# Patient Record
Sex: Female | Born: 1960 | Race: White | Hispanic: No | Smoking: Never smoker
Health system: Southern US, Community
[De-identification: ages and names within clinical notes are randomized; demographics above are authoritative.]

## PROBLEM LIST (undated history)

## (undated) DIAGNOSIS — Z789 Other specified health status: Secondary | ICD-10-CM

## (undated) DIAGNOSIS — H9190 Unspecified hearing loss, unspecified ear: Secondary | ICD-10-CM

## (undated) DIAGNOSIS — E079 Disorder of thyroid, unspecified: Secondary | ICD-10-CM

## (undated) HISTORY — PX: COCHLEAR IMPLANT: SUR684

## (undated) HISTORY — PX: BUNIONECTOMY: SHX129

## (undated) HISTORY — PX: SPINAL CORD STIMULATOR IMPLANT: SHX2422

## (undated) HISTORY — PX: ABDOMINAL HYSTERECTOMY: SHX81

## (undated) HISTORY — PX: BACK SURGERY: SHX140

## (undated) HISTORY — PX: CHOLECYSTECTOMY: SHX55

---

## 2016-03-14 ENCOUNTER — Ambulatory Visit (INDEPENDENT_AMBULATORY_CARE_PROVIDER_SITE_OTHER): Payer: BC Managed Care – PPO

## 2016-03-14 ENCOUNTER — Encounter: Payer: Self-pay | Admitting: Emergency Medicine

## 2016-03-14 ENCOUNTER — Ambulatory Visit
Admission: EM | Admit: 2016-03-14 | Discharge: 2016-03-14 | Disposition: A | Discharge status: Home / Self Care | Payer: BC Managed Care – PPO | Attending: Emergency Medicine | Admitting: Emergency Medicine

## 2016-03-14 DIAGNOSIS — M79672 Pain in left foot: Secondary | ICD-10-CM

## 2016-03-14 MED ORDER — IBUPROFEN 800 MG PO TABS: 800.0000 mg | ORAL_TABLET | Freq: Three times a day (TID) | ORAL | Status: DC

## 2016-03-14 NOTE — ED Provider Notes (Signed)
HPI  SUBJECTIVE:  Megan Huff is a 55 y.o. female who presents with constant dull, throbbing left lateral foot pain for the past 3 days. Patient states that her foot has become swollen and that she feels a "knot" on the lateral side of her foot. She states the pain is in the same area where she fractured the fourth and fifth metatarsals 8 months ago. She denies a change in physical activity, change in shoe wear. She denies any recent trauma to her foot. No bruising, numbness, tingling, color changes. There are no aggravating or alleviating factors. The pain is not associated with standing, walking, dorsiflexion, plantarflexion. There are no other aggravating or alleviating factors. She has not tried anything else other than rest and elevation for this. Past medical history of fourth and fifth metatarsal fractures left foot, chronic low back pain status post spinal cord stimulator. She does not take any narcotics for this. No history of diabetes, hypertension, smoking.   History reviewed. No pertinent past medical history.  Past Surgical History  Procedure Laterality Date  . Cholecystectomy    . Back surgery    . Spinal cord stimulator implant    . Bunionectomy    . Abdominal hysterectomy      No family history on file.  Social History  Substance Use Topics  . Smoking status: Never Smoker   . Smokeless tobacco: Never Used  . Alcohol Use: No    No current facility-administered medications for this encounter.  Current outpatient prescriptions:  .  ibuprofen (ADVIL,MOTRIN) 800 MG tablet, Take 1 tablet (800 mg total) by mouth 3 (three) times daily., Disp: 30 tablet, Rfl: 0  No Known Allergies   ROS  As noted in HPI.   Physical Exam  BP 125/63 mmHg  Pulse 94  Temp(Src) 97.9 F (36.6 C) (Tympanic)  Resp 16  Ht  (1.753 m)  Wt 290 lb (131.543 kg)  BMI 42.81 kg/m2  SpO2 93%  Constitutional: Well developed, well nourished, no acute distress Eyes:  EOMI, conjunctiva  normal bilaterally HENT: Normocephalic, atraumatic,mucus membranes moist Respiratory: Normal inspiratory effort Cardiovascular: Normal rate GI: nondistended skin: No rash, skin intact Musculoskeletal: Left midfoot NT. Base of Fourth, fifth metatarsal tender. No bruising. Skin intact. DP 2+. Refill less than 2 seconds. Sensation grossly intact. Patient able to move all toes actively.  no pain with inversion / eversion,  dorsiflexion / plantarflexion.  Distal fibula NT, Medial malleolus NT,  Deltoid ligament NT, Lateral ligaments NT, Achilles NT. Patient able to bear weight while in department. Neurologic: Alert & oriented x 3, no focal neuro deficits Psychiatric: Speech and behavior appropriate   ED Course   Medications - No data to display  Orders Placed This Encounter  Procedures  . DG Foot Complete Left    Standing Status: Standing     Number of Occurrences: 1     Standing Expiration Date:     Order Specific Question:  Reason for Exam (SYMPTOM  OR DIAGNOSIS REQUIRED)    Answer:  h/o 4th 5th prox MT fx, tender in this area, r/o acute changes    No results found for this or any previous visit (from the past 24 hour(s)). Dg Foot Complete Left  03/14/2016  CLINICAL DATA:  Fractures of fourth and fifth metatarsal. EXAM: LEFT FOOT - COMPLETE 3+ VIEW COMPARISON:  None. FINDINGS: Fixation pins in the first metatarsal. No fracture or dislocation of mid foot or forefoot. The phalanges are normal. The calcaneus is normal. No soft  tissue abnormality. IMPRESSION: No evidence acute fracture or dislocation. Electronically Signed   By: Genevive BiStewart  Edmunds M.D.   On: 03/14/2016 19:59    ED Clinical Impression  Left foot pain   ED Assessment/Plan   Reviewed imaging independently. No acute fracture or dislocation. See radiology report for details. Home with 1 g of Tylenol with 800 mg ibuprofen 3 times a day. Ice, elevate. Follow-up with a podiatrist as needed. Referring to Dr. Gwyneth RevelsJustin Fowler.    Discussed imaging, MDM, plan and followup with patient. Discussed sn/sx that should prompt return to the  ED. Patient agrees with plan.   *This clinic note was created using Dragon dictation software. Therefore, there may be occasional mistakes despite careful proofreading.  ?    Megan GongAshley Zyra Parrillo, MD 03/14/16 2028

## 2016-03-14 NOTE — Discharge Instructions (Signed)
800 mg of ibuprofen with 1 g of Tylenol 3 times a day as needed for pain and swelling. Keep the foot elevated above your heart as much as possible. Follow-up with a podiatrist in a week if not getting better. Go to the ER for color changes, temperature changes, or pain not controlled with medications.

## 2016-03-14 NOTE — ED Notes (Signed)
Left foot pain. Previous fracture in October.

## 2017-10-11 ENCOUNTER — Other Ambulatory Visit: Payer: Self-pay

## 2017-10-11 ENCOUNTER — Ambulatory Visit
Admission: EM | Admit: 2017-10-11 | Discharge: 2017-10-11 | Disposition: A | Discharge status: Home / Self Care | Payer: BC Managed Care – PPO | Attending: Family Medicine | Admitting: Family Medicine

## 2017-10-11 ENCOUNTER — Encounter: Payer: Self-pay | Admitting: Emergency Medicine

## 2017-10-11 DIAGNOSIS — H8141 Vertigo of central origin, right ear: Secondary | ICD-10-CM

## 2017-10-11 DIAGNOSIS — H9311 Tinnitus, right ear: Secondary | ICD-10-CM | POA: Diagnosis not present

## 2017-10-11 DIAGNOSIS — R42 Dizziness and giddiness: Secondary | ICD-10-CM

## 2017-10-11 HISTORY — DX: Other specified health status: Z78.9

## 2017-10-11 HISTORY — DX: Disorder of thyroid, unspecified: E07.9

## 2017-10-11 MED ORDER — ONDANSETRON 4 MG PO TBDP: 4.0000 mg | ORAL_TABLET | Freq: Three times a day (TID) | ORAL | 0 refills | Status: AC | PRN

## 2017-10-11 MED ORDER — MECLIZINE HCL 25 MG PO TABS
25.0000 mg | ORAL_TABLET | Freq: Once | ORAL | Status: AC
  Administered 2017-10-11: 25 mg via ORAL

## 2017-10-11 MED ORDER — MECLIZINE HCL 25 MG PO TABS: 25.0000 mg | ORAL_TABLET | Freq: Three times a day (TID) | ORAL | 0 refills | Status: AC | PRN

## 2017-10-11 MED ORDER — MECLIZINE HCL 25 MG PO TABS: 50.0000 mg | ORAL_TABLET | Freq: Once | ORAL | Status: DC

## 2017-10-11 NOTE — ED Provider Notes (Signed)
[facility] ____________________________________________  Time seen: Approximately 6:00 PM  I have reviewed the triage vital signs and the nursing notes.   HISTORY  Chief Complaint Tinnitus   HPI Megan Huff is a 56 y.o. female present for evaluation of 10 days of right ear muffled sensation and irritation.  States initially she thought she may have had cerumen impaction and started using peroxide to flush and irrigate her ears over the last day.  States hearing slightly improved, but reports irritation continued and states she began having ringing in her ear, and that the ringing in her ear increased today.  States high-pitched ringing to right ear only.  States left ear does feel somewhat muffled, but no hearing changes.  Denies any drainage from the ear, head or ear trauma, fevers.  States has not applied anything to her air other than peroxide.  States does have some accompanying nasal congestion, but states nasal congestion is mild and no cough.  Denies sinus pain.  Reports continues to eat and drink well.  States today with increased ringing in her ear she has had some nausea, no vomiting, but also states she is having some intermittent lightheaded and room spinning sensation spells that are associated with quick movements of her head.  Denies syncope or near syncope, paresthesias, weakness, vision changes, unilateral weakness, vomiting or diarrhea.  No over-the-counter medications taken prior to arrival.  No recent recurrent over-the-counter medication use other than the peroxide.  Denies history of the same.Denies chest pain, shortness of breath, abdominal pain,  or rash. Denies recent sickness. Denies recent antibiotic use.   PCP: [facility]    Past Medical History:  Diagnosis Date  . No known health problems   . Thyroid disease     There are no active problems to display for this patient.   Past Surgical History:  Procedure Laterality Date  . ABDOMINAL  HYSTERECTOMY    . BACK SURGERY    . BUNIONECTOMY    . CHOLECYSTECTOMY    . SPINAL CORD STIMULATOR IMPLANT       No current facility-administered medications for this encounter.   Current Outpatient Medications:  .  meclizine (ANTIVERT) 25 MG tablet, Take 1 tablet (25 mg total) by mouth 3 (three) times daily as needed for dizziness., Disp: 30 tablet, Rfl: 0 .  ondansetron (ZOFRAN ODT) 4 MG disintegrating tablet, Take 1 tablet (4 mg total) by mouth every 8 (eight) hours as needed for nausea or vomiting., Disp: 10 tablet, Rfl: 0  Allergies Patient has no known allergies.  Family History  Problem Relation Age of Onset  . Hypertension Mother   . Hyperlipidemia Mother   . Cancer Mother 91       lung  . Hypertension Father   . Heart disease Father   . Diabetes Father   . Hyperlipidemia Father     Social History Social History   Tobacco Use  . Smoking status: Never Smoker  . Smokeless tobacco: Never Used  Substance Use Topics  . Alcohol use: No  . Drug use: No    Review of Systems Constitutional: No fever/chills Eyes: No visual changes. ENT: No sore throat. Cardiovascular: Denies chest pain. Respiratory: Denies shortness of breath. Gastrointestinal: No abdominal pain.  No vomiting.  No diarrhea.  No constipation. Genitourinary: Negative for dysuria. Musculoskeletal: Negative for back pain. Skin: Negative for rash. Neurological: Negative for focal weakness or numbness.  States did have a mild headache today, but resolved.  ____________________________________________   PHYSICAL EXAM:  VITAL SIGNS: ED Triage Vitals  Enc Vitals Group     BP 10/11/17 1701 133/75     Pulse Rate 10/11/17 1701 62     Resp 10/11/17 1701 16     Temp 10/11/17 1701 97.6 F (36.4 C)     Temp Source 10/11/17 1701 Oral     SpO2 10/11/17 1701 97 %     Weight 10/11/17 1700 225 lb (102.1 kg)     Height 10/11/17 1700 5\' 9"  (1.753 m)     Head Circumference --      Peak Flow --      Pain  Score 10/11/17 1701 0     Pain Loc --      Pain Edu? --      Excl. in GC? --     Constitutional: Alert and oriented. Well appearing and in no acute distress. Eyes: Conjunctivae are normal. PERRL. EOMI. ENT      Head: Normocephalic and atraumatic.      Ears: Left: Nontender, normal canal, minimal effusion noted, no erythema, otherwise normal TM.  Right: Nontender, normal canal, effusion noted, no erythema and otherwise normal TM.  Hearing grossly intact bilaterally.  No mastoid tenderness bilaterally.  No surrounding tenderness, swelling or erythema bilaterally.      Nose: Mild nasal congestion.      Mouth/Throat: Mucous membranes are moist.Oropharynx non-erythematous.  No tonsillar swelling or exudate. Neck: No stridor. Supple without meningismus.  Hematological/Lymphatic/Immunilogical: No cervical lymphadenopathy. Cardiovascular: Normal rate, regular rhythm. Grossly normal heart sounds.  Good peripheral circulation. Respiratory: Normal respiratory effort without tachypnea nor retractions. Breath sounds are clear and equal bilaterally. No wheezes, rales, rhonchi. Gastrointestinal: Soft and nontender.  Musculoskeletal:  Nontender with normal range of motion in all extremities. No midline cervical, thoracic or lumbar tenderness to palpation. Neurologic:  Normal speech and language. No gross focal neurologic deficits are appreciated. Speech is normal. No gait instability.  Steady gait.  Negative pronator drift.  Negative Romberg.  No ataxia.  No paresthesias.  Positive right sided Dix-Hallpike maneuver. Skin:  Skin is warm, dry and intact. No rash noted. Psychiatric: Mood and affect are normal. Speech and behavior are normal. Patient exhibits appropriate insight and judgment   ___________________________________________   LABS (all labs ordered are listed, but only abnormal results are displayed)  Labs Reviewed - No data to  display ____________________________________________   PROCEDURES Procedures   INITIAL IMPRESSION / ASSESSMENT AND PLAN / ED COURSE  Pertinent labs & imaging results that were available during my care of the patient were reviewed by me and considered in my medical decision making (see chart for details).  Overall well-appearing patient.  No acute distress.  Has had 1-2 weeks of right ear discomfort and clogged sensation, using over-the-counter peroxide with onset of tinnitus that worsened today.  No focal neurological deficits, less likely cental problem.  Suspect right ear tinnitus and associated vertigo.  Meclizine given in office, will continue to monitor and reevaluate.  After meclizine, patient reports she is feeling much better and lightheadedness, nausea and room spinning sensation has resolved and reports feeling better.  Recommend ENT follow-up. Cautioned and counseled regarding strict follow up and return parameters, as well as proceeding to emergency room for worsening concerns.Discussed indication, risks and benefits of medications with patient.  Discussed follow up with Primary care physician this week. Discussed follow up and return parameters including no resolution or any worsening concerns. Patient verbalized understanding and agreed to plan.   ____________________________________________   FINAL CLINICAL  IMPRESSION(S) / ED DIAGNOSES  Final diagnoses:  Tinnitus of right ear  Vertigo     ED Discharge Orders        Ordered    meclizine (ANTIVERT) 25 MG tablet  3 times daily PRN     10/11/17 1905    ondansetron (ZOFRAN ODT) 4 MG disintegrating tablet  Every 8 hours PRN     10/11/17 1905       Note: This dictation was prepared with Dragon dictation along with smaller phrase technology. Any transcriptional errors that result from this process are unintentional.         Renford DillsMiller, Shamond Skelton, NP 10/11/17 1910

## 2017-10-11 NOTE — ED Triage Notes (Signed)
Patient in today c/o 10 days history of right ear fullness. She has been putting in water/peroxide solution because she thought she had a wax build up. Patient now c/o ringing in her right ear and dizziness that started this afternoon.

## 2017-10-11 NOTE — Discharge Instructions (Signed)
Take medication as prescribed. Rest. Drink plenty of fluids.   Follow up with your primary care physician or Ear nose and throat this week. Return to Urgent care or Emergency room for headache, persistent dizziness, feeling like you are passing out, fevers, new or worsening concerns.

## 2018-12-11 ENCOUNTER — Ambulatory Visit (INDEPENDENT_AMBULATORY_CARE_PROVIDER_SITE_OTHER): Payer: BC Managed Care – PPO

## 2018-12-11 ENCOUNTER — Other Ambulatory Visit: Payer: Self-pay

## 2018-12-11 ENCOUNTER — Ambulatory Visit
Admission: EM | Admit: 2018-12-11 | Discharge: 2018-12-11 | Disposition: A | Discharge status: Home / Self Care | Payer: BC Managed Care – PPO | Attending: Family Medicine | Admitting: Family Medicine

## 2018-12-11 DIAGNOSIS — W228XXA Striking against or struck by other objects, initial encounter: Secondary | ICD-10-CM | POA: Diagnosis not present

## 2018-12-11 DIAGNOSIS — M7752 Other enthesopathy of left foot: Secondary | ICD-10-CM

## 2018-12-11 DIAGNOSIS — M7750 Other enthesopathy of unspecified foot: Secondary | ICD-10-CM

## 2018-12-11 HISTORY — DX: Unspecified hearing loss, unspecified ear: H91.90

## 2018-12-11 NOTE — ED Triage Notes (Signed)
Pt with left foot fracture two years ago. 10 days ago she stepped on a rock in the same place and heard a crack. No pain initially but now pain getting worse. Pain 3/10

## 2018-12-11 NOTE — Discharge Instructions (Signed)
Rest, ice, elevation, over the counter anti-inflammatories °

## 2018-12-11 NOTE — ED Provider Notes (Signed)
[facility]    CSN: [id number] Arrival date & time: 12/11/18  1213     History   Chief Complaint Chief Complaint  Patient presents with  . Foot Pain    HPI Megan Huff is a 58 y.o. female.   58 yo female with a c/o left foot pain for the past 10 days since stepping on a rock. States she's had surgery to that foot 2 years ago due to a fracture.   The history is provided by the patient.    Past Medical History:  Diagnosis Date  . Hearing loss   . No known health problems   . Thyroid disease     There are no active problems to display for this patient.   Past Surgical History:  Procedure Laterality Date  . ABDOMINAL HYSTERECTOMY    . BACK SURGERY    . BUNIONECTOMY    . CHOLECYSTECTOMY    . COCHLEAR IMPLANT    . SPINAL CORD STIMULATOR IMPLANT      OB History   No obstetric history on file.      Home Medications    Prior to Admission medications   Medication Sig Start Date End Date Taking? Authorizing Provider  meclizine (ANTIVERT) 25 MG tablet Take 1 tablet (25 mg total) by mouth 3 (three) times daily as needed for dizziness. 10/11/17   Renford DillsMiller, Lindsey, NP  ondansetron (ZOFRAN ODT) 4 MG disintegrating tablet Take 1 tablet (4 mg total) by mouth every 8 (eight) hours as needed for nausea or vomiting. 10/11/17   Renford DillsMiller, Lindsey, NP    Family History Family History  Problem Relation Age of Onset  . Hypertension Mother   . Hyperlipidemia Mother   . Cancer Mother [id number]       lung  . Hypertension Father   . Heart disease Father   . Diabetes Father   . Hyperlipidemia Father     Social History Social History   Tobacco Use  . Smoking status: Never Smoker  . Smokeless tobacco: Never Used  Substance Use Topics  . Alcohol use: No  . Drug use: No     Allergies   Patient has no known allergies.   Review of Systems Review of Systems   Physical Exam Triage Vital Signs ED Triage Vitals  Enc Vitals Group     BP 12/11/18 1221 113/76      Pulse Rate 12/11/18 1221 84     Resp 12/11/18 1221 16     Temp 12/11/18 1221 98.3 F (36.8 C)     Temp Source 12/11/18 1221 Oral     SpO2 12/11/18 1221 100 %     Weight 12/11/18 1223 292 lb (132.5 kg)     Height 12/11/18 1223 5\' 9"  (1.753 m)     Head Circumference --      Peak Flow --      Pain Score 12/11/18 1223 3     Pain Loc --      Pain Edu? --      Excl. in GC? --    No data found.  Updated Vital Signs BP 113/76 (BP Location: Right Arm)   Pulse 84   Temp 98.3 F (36.8 C) (Oral)   Resp 16   Ht 5\' 9"  (1.753 m)   Wt 132.5 kg   SpO2 100%   BMI 43.12 kg/m   Visual Acuity Right Eye Distance:   Left Eye Distance:   Bilateral Distance:    Right Eye Near:  Left Eye Near:    Bilateral Near:     Physical Exam Vitals signs and nursing note reviewed.  Constitutional:      General: She is not in acute distress.    Appearance: She is not toxic-appearing or diaphoretic.  Musculoskeletal:     Left foot: Normal range of motion and normal capillary refill. Tenderness (over the peroneal tendon at insertion area) and bony tenderness present. No swelling, crepitus, deformity or laceration.  Neurological:     Mental Status: She is alert.      UC Treatments / Results  Labs (all labs ordered are listed, but only abnormal results are displayed) Labs Reviewed - No data to display  EKG None  Radiology Dg Foot Complete Left  Result Date: 12/11/2018 CLINICAL DATA:  Left foot pain after injury 10 days ago. EXAM: LEFT FOOT - COMPLETE 3+ VIEW COMPARISON:  Radiographs of Mar 14, 2016. FINDINGS: There is no evidence of fracture or dislocation. Postsurgical changes are noted in the distal first metatarsal. 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 Moderate degenerative joint disease of the first metatarsophalangeal joint is noted. Soft tissues are unremarkable. IMPRESSION: Postsurgical and  degenerative changes as described above. No acute abnormality seen in the left foot. Electronically Signed   By: Lupita Raider, M.D.   On: 12/11/2018 13:01    Procedures Procedures (including critical care time)  Medications Ordered in UC Medications - No data to display  Initial Impression / Assessment and Plan / UC Course  I have reviewed the triage vital signs and the nursing notes.  Pertinent labs & imaging results that were available during my care of the patient were reviewed by me and considered in my medical decision making (see chart for details).      Final Clinical Impressions(s) / UC Diagnoses   Final diagnoses:  Tendonitis of foot     Discharge Instructions     Rest, ice, elevation, over the counter anti-inflammatories    ED Prescriptions    None     1. x-ray results (negative for acute findings) and diagnosis reviewed with patient 2. Recommend supportive treatment as above 3. Follow-up prn if symptoms worsen or don't improve  Controlled Substance Prescriptions [place] Controlled Substance Registry consulted? Not Applicable   Payton Mccallum, MD 12/11/18 1419

## 2020-09-17 IMAGING — CR DG FOOT COMPLETE 3+V*L*
3 series · 4 of 4 positions shown · non-contrast
Comparison: Radiographs March 14, 2016.

CLINICAL DATA: Left foot pain after injury 10 days ago.

EXAM:
LEFT FOOT - COMPLETE 3+ VIEW

[foot ap]
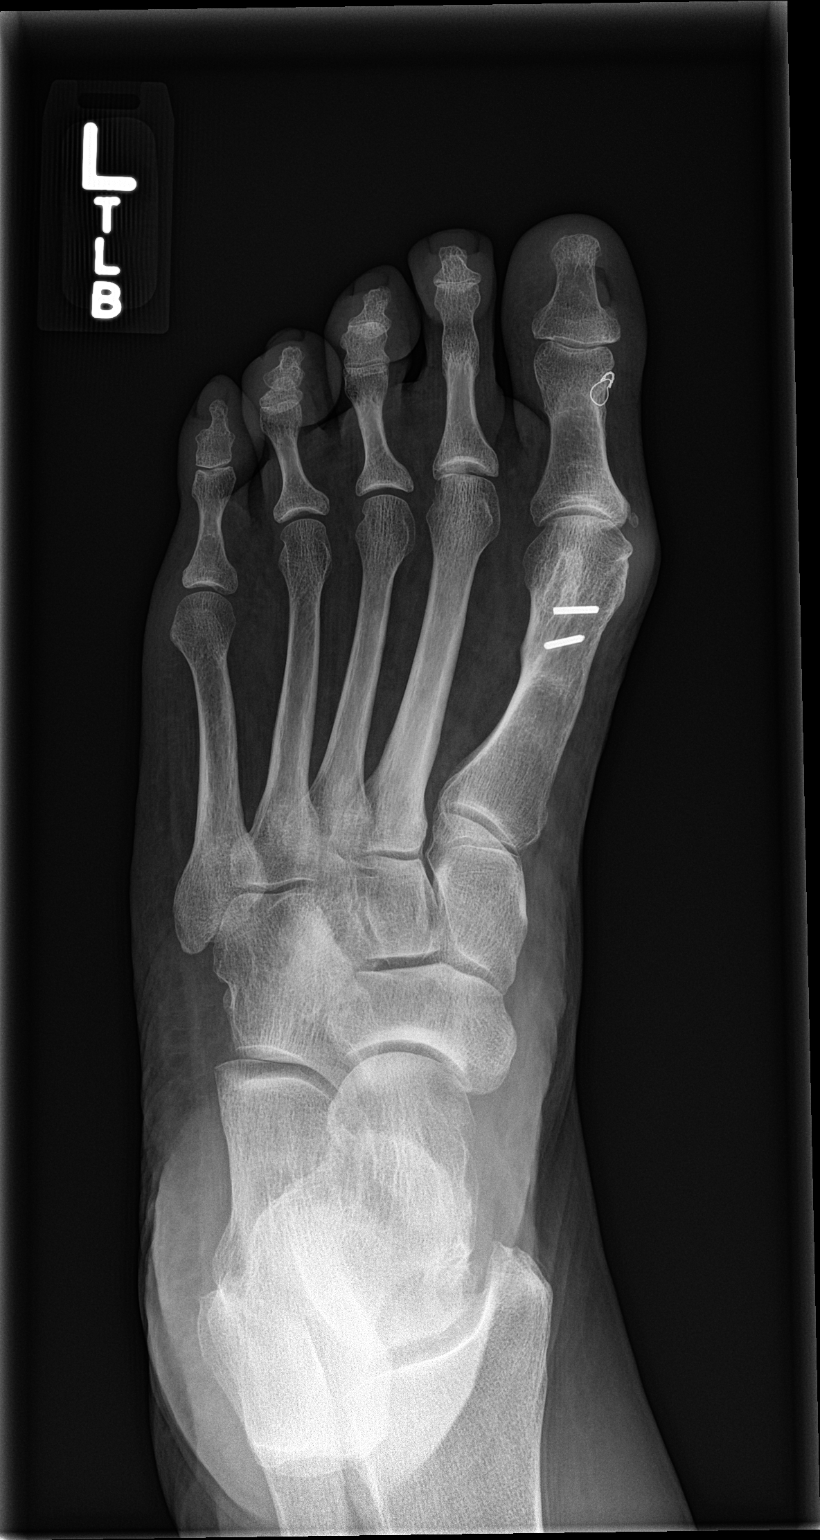

[foot obl]
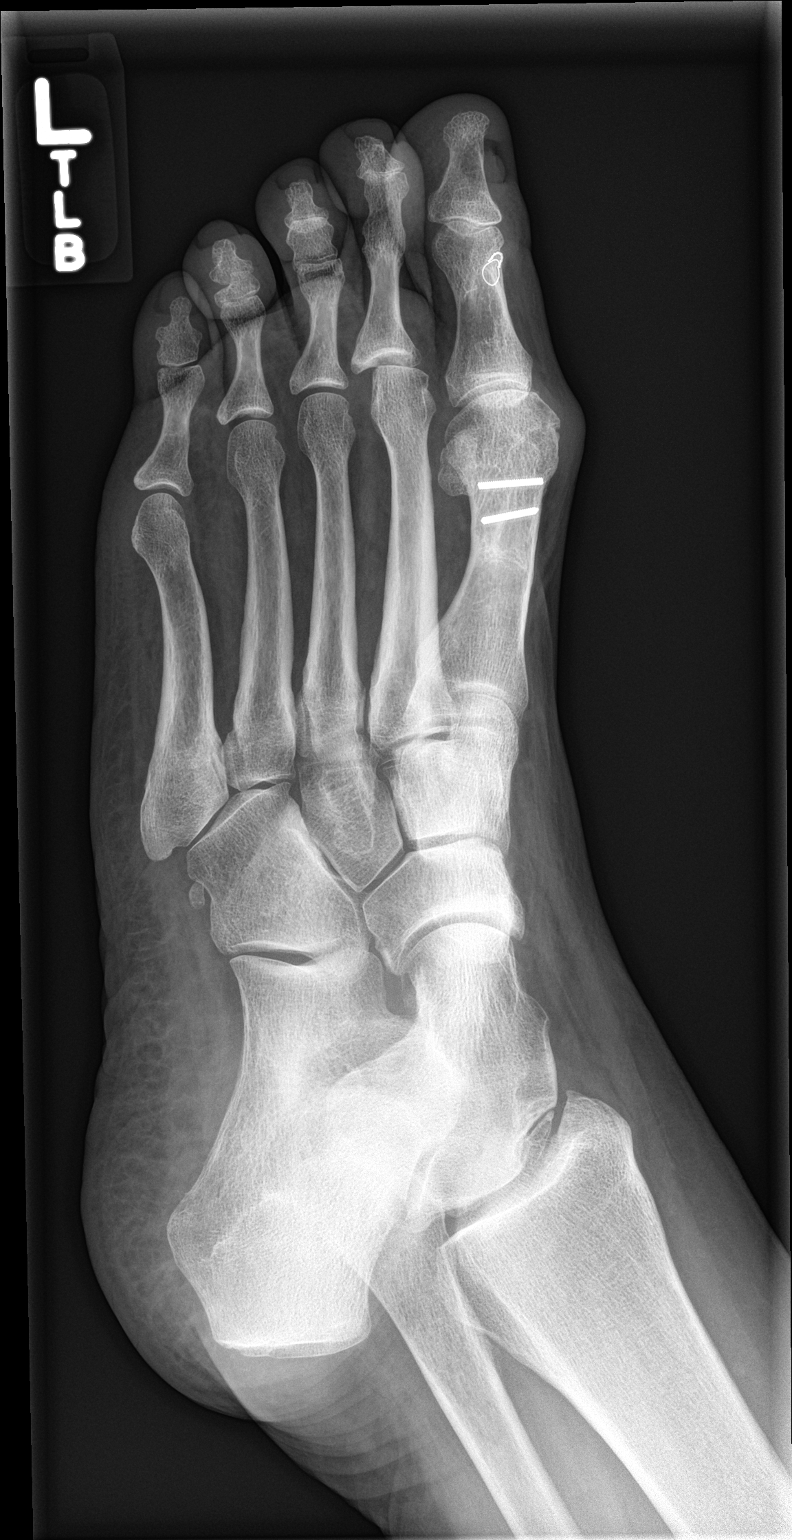

[Series 3: foot lat · 0.14mm/px · 2 of 2 slices shown]
[im 1/2]
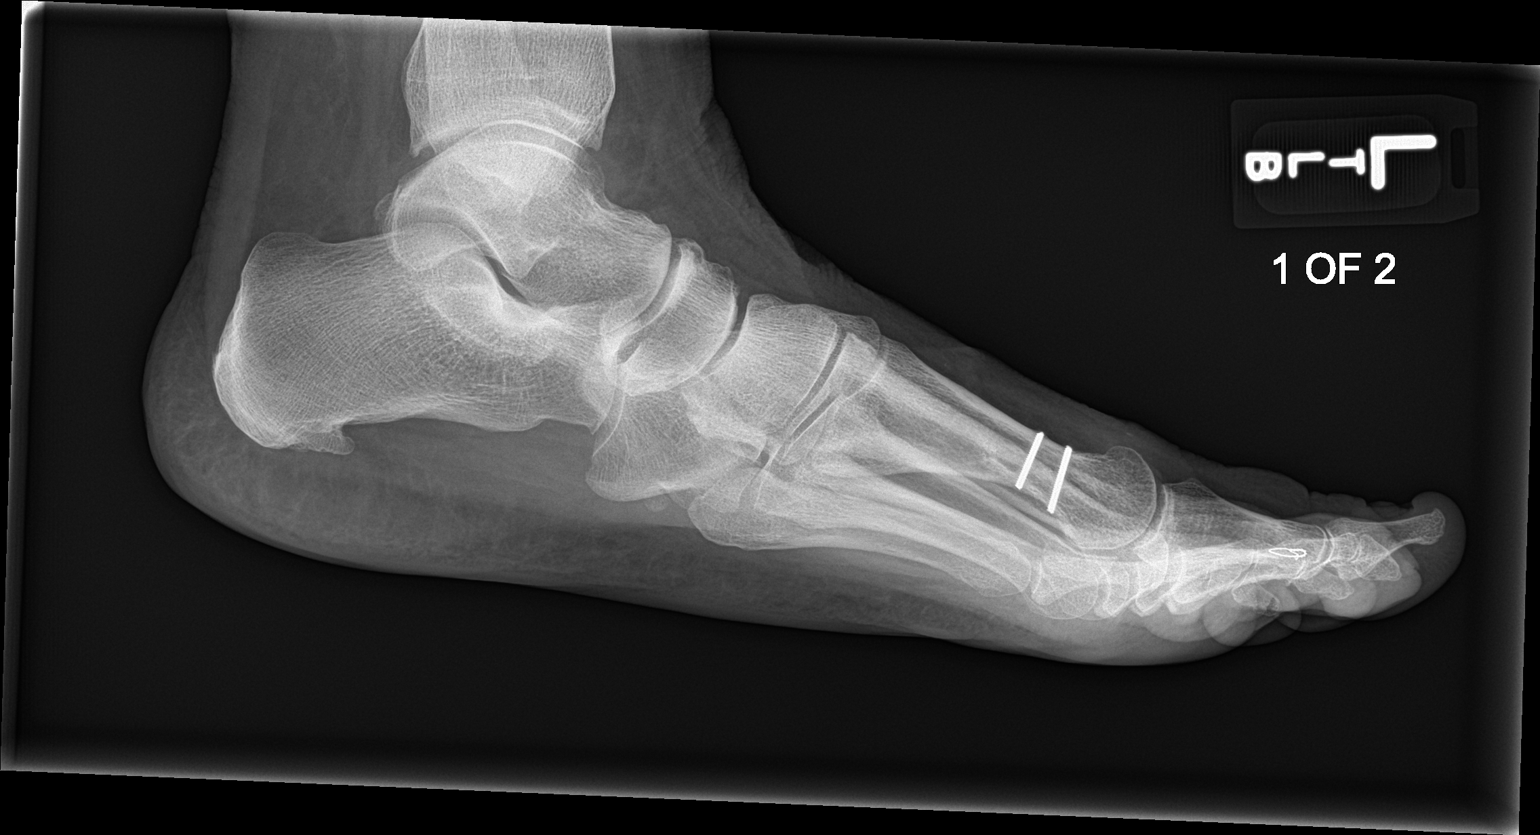
[im 2/2]
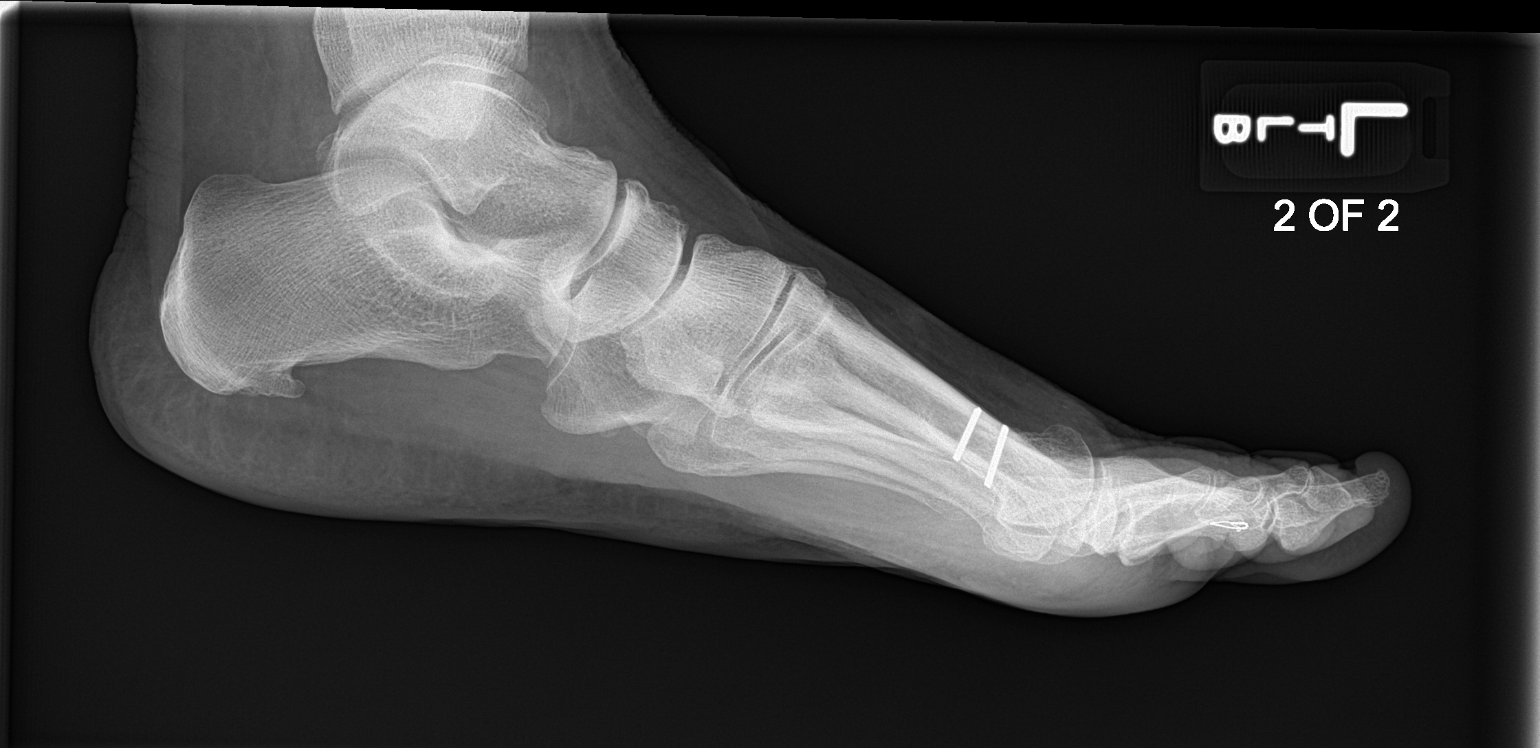

[4 of 4 positions shown; findings below may reference images not displayed]

FINDINGS: There is no evidence of fracture or dislocation. Postsurgical
changes are noted in the distal first metatarsal. Probable
congenital fusion of the second proximal interphalangeal joint is
noted mild posterior calcaneal spurring is noted.. Moderate
degenerative joint disease of the first metatarsophalangeal joint is
noted. Soft tissues are unremarkable.
IMPRESSION: Postsurgical and degenerative changes as described above. No acute
abnormality seen in the left foot.
